# Patient Record
Sex: Male | Born: 1990 | Race: White | Hispanic: No | Marital: Single | Smoking: Never smoker
Health system: Southern US, Community
[De-identification: ages and names within clinical notes are randomized; demographics above are authoritative.]

---

## 2012-11-15 ENCOUNTER — Encounter (HOSPITAL_COMMUNITY): Payer: Self-pay | Admitting: Emergency Medicine

## 2012-11-15 ENCOUNTER — Emergency Department (HOSPITAL_COMMUNITY)
Admission: EM | Admit: 2012-11-15 | Discharge: 2012-11-15 | Disposition: A | Discharge status: Home / Self Care | Payer: Commercial Managed Care - PPO | Source: Home / Self Care | Attending: Family Medicine | Admitting: Family Medicine

## 2012-11-15 DIAGNOSIS — L0211 Cutaneous abscess of neck: Secondary | ICD-10-CM

## 2012-11-15 MED ORDER — DOXYCYCLINE HYCLATE 100 MG PO CAPS: 100.0000 mg | ORAL_CAPSULE | Freq: Two times a day (BID) | ORAL | Status: DC

## 2012-11-15 NOTE — ED Provider Notes (Signed)
History     CSN: [id number]  Arrival date & time 11/15/12  1632   First MD Initiated Contact with Patient 11/15/12 1644      Chief Complaint  Patient presents with  . Cyst    (Consider location/radiation/quality/duration/timing/severity/associated sxs/prior treatment) Patient is a 22 y.o. male presenting with abscess. The history is provided by the patient.  Abscess Location:  Head/neck Head/neck abscess location:  L neck Abscess quality: fluctuance, painful and redness   Red streaking: no   Duration:  3 days Progression:  Worsening Pain details:    Quality:  Pressure and dull   Severity:  Moderate Chronicity:  Recurrent Worsened by:  Topical antibiotics   History reviewed. No pertinent past medical history.  History reviewed. No pertinent past surgical history.  No family history on file.  History  Substance Use Topics  . Smoking status: Not on file  . Smokeless tobacco: Not on file  . Alcohol Use: Not on file      Review of Systems  Constitutional: Negative.   HENT: Positive for neck pain.   Skin: Positive for rash.    Allergies  Review of patient's allergies indicates no known allergies.  Home Medications   Current Outpatient Rx  Name  Route  Sig  Dispense  Refill  . doxycycline (VIBRAMYCIN) 100 MG capsule   Oral   Take 1 capsule (100 mg total) by mouth 2 (two) times daily.   20 capsule   0     BP 144/81  Pulse 58  Temp(Src) 98.3 F (36.8 C) (Oral)  Resp 16  SpO2 98%  Physical Exam  Nursing note and vitals reviewed. Constitutional: He appears well-developed and well-nourished.  Skin: Skin is warm and dry. There is erythema.  Fluctuant abscess to left neck.    ED Course  INCISION AND DRAINAGE Date/Time: 11/15/2012 5:34 PM Performed by: Linna Hoff Authorized by: Bradd Canary D Consent: Verbal consent obtained. Risks and benefits: risks, benefits and alternatives were discussed Consent given by: patient Type: abscess Body  area: head/neck Location details: neck Anesthesia: local infiltration Local anesthetic: lidocaine 2% without epinephrine Patient sedated: no Scalpel size: 11 Incision type: single straight Complexity: simple Drainage: purulent Drainage amount: moderate Wound treatment: wound left open Patient tolerance: Patient tolerated the procedure well with no immediate complications. Comments: Culture obtained.   (including critical care time)  Labs Reviewed  CULTURE, ROUTINE-ABSCESS   No results found.   1. Abscess of skin of neck       MDM          Linna Hoff, MD 11/15/12 [phone]

## 2012-11-15 NOTE — ED Notes (Signed)
Pt c/o cyst on left side of neck since Saturday Reports he was seeing a dermatologist in [place] who was going to remove it, but moved to [facility] [place] Denies: drainage, f/v/n/d Sx include: tender Has been using topical clindamycin   He is alert w/no signs of acute distress.

## 2012-11-19 LAB — CULTURE, ROUTINE-ABSCESS

## 2013-05-23 ENCOUNTER — Encounter (HOSPITAL_COMMUNITY): Payer: Self-pay

## 2013-05-23 ENCOUNTER — Emergency Department (HOSPITAL_COMMUNITY)
Admission: EM | Admit: 2013-05-23 | Discharge: 2013-05-23 | Disposition: A | Discharge status: Home / Self Care | Payer: Commercial Managed Care - PPO | Source: Home / Self Care | Attending: Emergency Medicine | Admitting: Emergency Medicine

## 2013-05-23 DIAGNOSIS — L509 Urticaria, unspecified: Secondary | ICD-10-CM

## 2013-05-23 MED ORDER — PREDNISONE 20 MG PO TABS: 40.0000 mg | ORAL_TABLET | Freq: Every day | ORAL | Status: AC

## 2013-05-23 MED ORDER — TRIAMCINOLONE ACETONIDE 0.1 % EX CREA: TOPICAL_CREAM | Freq: Two times a day (BID) | CUTANEOUS | Status: AC

## 2013-05-23 MED ORDER — HYDROXYZINE HCL 25 MG PO TABS: 25.0000 mg | ORAL_TABLET | Freq: Four times a day (QID) | ORAL | Status: AC

## 2013-05-23 NOTE — ED Provider Notes (Signed)
CSN: [id number]     Arrival date & time 05/23/13  1050 History     First MD Initiated Contact with Patient 05/23/13 1130     Chief Complaint  Patient presents with  . Rash   (Consider location/radiation/quality/duration/timing/severity/associated sxs/prior Treatment) HPI Comments: Patient presents urgent care describing for the last 4-5 days he has developed a rash that has since then progress. It has been affecting his lower extremities mainly his left side his knee his ankle region up to his thighs and some on his lower abdominal wall. It's itchy and it's kind of spreading have some on my forearms. Patient denies any further constitutional symptoms such as fevers generalized malaise arthralgias myalgias or headaches. He does mention that he recently returned from [place] and denies any further symptoms such as difficulty swallowing facial swelling or difficulty breathing. Patient has not tried to take anything over-the-counter as of yet.  Patient is a 22 y.o. male presenting with rash. The history is provided by the patient.  Rash Pain quality: no pressure, no stiffness, not throbbing and not tugging   Pain radiates to:  L leg, R leg, back and chest Pain severity:  Mild Onset quality:  Sudden Duration:  5 days Progression:  Worsening Chronicity:  New Context: recent travel   Context: not eating, not recent sexual activity, not retching, not sick contacts, not suspicious food intake and not trauma   Associated symptoms: no chills, no fatigue and no fever     History reviewed. No pertinent past medical history. History reviewed. No pertinent past surgical history. History reviewed. No pertinent family history. History  Substance Use Topics  . Smoking status: Never Smoker   . Smokeless tobacco: Not on file  . Alcohol Use: Yes    Review of Systems  Constitutional: Negative for fever, chills, diaphoresis, activity change, appetite change, fatigue and unexpected weight change.  Skin:  Positive for rash. Negative for color change, pallor and wound.  Hematological: Does not bruise/bleed easily.    Allergies  Review of patient's allergies indicates no known allergies.  Home Medications   Current Outpatient Rx  Name  Route  Sig  Dispense  Refill  . hydrOXYzine (ATARAX/VISTARIL) 25 MG tablet   Oral   Take 1 tablet (25 mg total) by mouth every 6 (six) hours.   12 tablet   0   . predniSONE (DELTASONE) 20 MG tablet   Oral   Take 2 tablets (40 mg total) by mouth daily. 2 tablets daily for 5 days   14 tablet   0   . triamcinolone cream (KENALOG) 0.1 %   Topical   Apply topically 2 (two) times daily. Apply bid x 2 weeks   30 g   0    BP 132/67  Pulse 60  Temp(Src) 98 F (36.7 C) (Oral)  Resp 16  SpO2 100% Physical Exam  Nursing note and vitals reviewed. Constitutional: Vital signs are normal. He appears well-developed and well-nourished.  Non-toxic appearance. He does not have a sickly appearance. He does not appear ill. No distress.  HENT:  Head: Normocephalic.  Mouth/Throat: No oropharyngeal exudate.  Eyes: Conjunctivae are normal. Right eye exhibits no discharge. Left eye exhibits no discharge. No scleral icterus.  Neck: Neck supple.  Neurological: He is alert.  Skin: Rash noted. No erythema.       ED Course   Procedures (including critical care time)  Labs Reviewed - No data to display No results found. 1. Urticaria     MDM  Physical exam was consistent with a global generalized urticarial and pruritic rash  Patient will be treated with both antihistamines, topical steroids and a short course of prednisone for 7 days. Have been advised that if no improvement after 14 days that he can consult with dermatologist. Patient agrees with treatment plan and followup care as necessary.   New Prescriptions   HYDROXYZINE (ATARAX/VISTARIL) 25 MG TABLET    Take 1 tablet (25 mg total) by mouth every 6 (six) hours.   PREDNISONE (DELTASONE) 20 MG TABLET     Take 2 tablets (40 mg total) by mouth daily. 2 tablets daily for 5 days   TRIAMCINOLONE CREAM (KENALOG) 0.1 %    Apply topically 2 (two) times daily. Apply bid x 2 weeks    Jimmie Molly, MD 05/23/13 1235

## 2013-05-23 NOTE — ED Notes (Signed)
Recent return from [place]; generalized rash past 5 days

## 2015-01-19 ENCOUNTER — Encounter (HOSPITAL_COMMUNITY): Payer: Self-pay | Admitting: Emergency Medicine

## 2015-01-19 ENCOUNTER — Emergency Department (HOSPITAL_COMMUNITY)
Admission: EM | Admit: 2015-01-19 | Discharge: 2015-01-19 | Disposition: A | Discharge status: Home / Self Care | Payer: Commercial Managed Care - PPO | Source: Home / Self Care | Attending: Family Medicine | Admitting: Family Medicine

## 2015-01-19 DIAGNOSIS — H6123 Impacted cerumen, bilateral: Secondary | ICD-10-CM

## 2015-01-19 DIAGNOSIS — H6092 Unspecified otitis externa, left ear: Secondary | ICD-10-CM | POA: Diagnosis not present

## 2015-01-19 MED ORDER — CIPROFLOXACIN-DEXAMETHASONE 0.3-0.1 % OT SUSP: 4.0000 [drp] | Freq: Two times a day (BID) | OTIC | Status: AC

## 2015-01-19 NOTE — ED Provider Notes (Signed)
CSN: [id number]     Arrival date & time 01/19/15  1210 History   First MD Initiated Contact with Patient 01/19/15 1251     Chief Complaint  Patient presents with  . Ear Fullness   (Consider location/radiation/quality/duration/timing/severity/associated sxs/prior Treatment) HPI    24 year old male presents complaining of bilateral ear fullness for 3 days. He also has decreased hearing. He assumes she has earwax, he was trying to remove it but he has caused pain and irritation in his left ear. No drainage. No systemic symptoms.  History reviewed. No pertinent past medical history. History reviewed. No pertinent past surgical history. No family history on file. History  Substance Use Topics  . Smoking status: Never Smoker   . Smokeless tobacco: Not on file  . Alcohol Use: Yes    Review of Systems  HENT: Positive for ear pain and hearing loss.   All other systems reviewed and are negative.   Allergies  Review of patient's allergies indicates no known allergies.  Home Medications   Prior to Admission medications   Medication Sig Start Date End Date Taking? Authorizing Provider  ciprofloxacin-dexamethasone (CIPRODEX) otic suspension Place 4 drops into the left ear 2 (two) times daily. 01/19/15   Graylon GoodZachary H Dejay Kronk, PA-C  hydrOXYzine (ATARAX/VISTARIL) 25 MG tablet Take 1 tablet (25 mg total) by mouth every 6 (six) hours. 05/23/13   Jimmie MollyPaolo Coll, MD   BP 111/71 mmHg  Pulse 71  Temp(Src) 97.6 F (36.4 C) (Oral)  Resp 16  SpO2 98% Physical Exam  Constitutional: He is oriented to person, place, and time. He appears well-developed and well-nourished. No distress.  HENT:  Head: Normocephalic and atraumatic.  Cerumen impaction occluding bilateral TMs   Pulmonary/Chest: Effort normal. No respiratory distress.  Neurological: He is alert and oriented to person, place, and time. Coordination normal.  Skin: Skin is warm and dry. No rash noted. He is not diaphoretic.  Psychiatric: He has a  normal mood and affect. Judgment normal.  Nursing note and vitals reviewed.   ED Course  Procedures (including critical care time) Labs Review Labs Reviewed - No data to display  Imaging Review No results found.   MDM   1. Cerumen impaction, bilateral   2. Otitis externa, left     Cerumen rinsed out. Examination afterwards reveals a normal right tympanic membrane, however the left is erythematous. The canal is erythematous and swollen as well. Treat with Ciprodex drops, follow-up when necessary if worsening  Meds ordered this encounter  Medications  . ciprofloxacin-dexamethasone (CIPRODEX) otic suspension    Sig: Place 4 drops into the left ear 2 (two) times daily.    Dispense:  7.5 mL    Refill:  0    Order Specific Question:  Supervising Provider    Answer:  Bradd CanaryKINDL, JAMES D [[id number]]     Graylon GoodZachary H Cambry Spampinato, PA-C 01/19/15 1304

## 2015-01-19 NOTE — ED Notes (Signed)
C/o bilateral ear fullness onset 3 days w/decreased hearing Denies fevers, chills, cold sx  Alert, no signs of acute distress.

## 2015-01-19 NOTE — Discharge Instructions (Signed)
Cerumen Impaction °A cerumen impaction is when the wax in your ear forms a plug. This plug usually causes reduced hearing. Sometimes it also causes an earache or dizziness. Removing a cerumen impaction can be difficult and painful. The wax sticks to the ear canal. The canal is sensitive and bleeds easily. If you try to remove a heavy wax buildup with a cotton tipped swab, you may push it in further. °Irrigation with water, suction, and small ear curettes may be used to clear out the wax. If the impaction is fixed to the skin in the ear canal, ear drops may be needed for a few days to loosen the wax. People who build up a lot of wax frequently can use ear wax removal products available in your local drugstore. °SEEK MEDICAL CARE IF:  °You develop an earache, increased hearing loss, or marked dizziness. °Document Released: 10/23/2004 Document Revised: 12/08/2011 Document Reviewed: 12/13/2009 °ExitCare® Patient Information ©2015 [employer]. This information is not intended to replace advice given to you by your health care provider. Make sure you discuss any questions you have with your health care provider. ° °Otitis Externa °Otitis externa is a bacterial or fungal infection of the outer ear canal. This is the area from the eardrum to the outside of the ear. Otitis externa is sometimes called "swimmer's ear." °CAUSES  °Possible causes of infection include: °· Swimming in dirty water. °· Moisture remaining in the ear after swimming or bathing. °· Mild injury (trauma) to the ear. °· Objects stuck in the ear (foreign body). °· Cuts or scrapes (abrasions) on the outside of the ear. °SIGNS AND SYMPTOMS  °The first symptom of infection is often itching in the ear canal. Later signs and symptoms may include swelling and redness of the ear canal, ear pain, and yellowish-white fluid (pus) coming from the ear. The ear pain may be worse when pulling on the earlobe. °DIAGNOSIS  °Your health care provider will perform a  physical exam. A sample of fluid may be taken from the ear and examined for bacteria or fungi. °TREATMENT  °Antibiotic ear drops are often given for 10 to 14 days. Treatment may also include pain medicine or corticosteroids to reduce itching and swelling. °HOME CARE INSTRUCTIONS  °· Apply antibiotic ear drops to the ear canal as prescribed by your health care provider. °· Take medicines only as directed by your health care provider. °· If you have diabetes, follow any additional treatment instructions from your health care provider. °· Keep all follow-up visits as directed by your health care provider. °PREVENTION  °· Keep your ear dry. Use the corner of a towel to absorb water out of the ear canal after swimming or bathing. °· Avoid scratching or putting objects inside your ear. This can damage the ear canal or remove the protective wax that lines the canal. This makes it easier for bacteria and fungi to grow. °· Avoid swimming in lakes, polluted water, or poorly chlorinated pools. °· You may use ear drops made of rubbing alcohol and vinegar after swimming. Combine equal parts of white vinegar and alcohol in a bottle. Put 3 or 4 drops into each ear after swimming. °SEEK MEDICAL CARE IF:  °· You have a fever. °· Your ear is still red, swollen, painful, or draining pus after 3 days. °· Your redness, swelling, or pain gets worse. °· You have a severe headache. °· You have redness, swelling, pain, or tenderness in the area behind your ear. °MAKE SURE YOU:  °·   Understand these instructions. °· Will watch your condition. °· Will get help right away if you are not doing well or get worse. °Document Released: 09/15/2005 Document Revised: 01/30/2014 Document Reviewed: 10/02/2011 °ExitCare® Patient Information ©2015 [employer]. This information is not intended to replace advice given to you by your health care provider. Make sure you discuss any questions you have with your health care provider. ° °

## 2015-03-11 ENCOUNTER — Encounter (HOSPITAL_COMMUNITY): Payer: Self-pay | Admitting: Emergency Medicine

## 2015-03-11 ENCOUNTER — Emergency Department (HOSPITAL_COMMUNITY)
Admission: EM | Admit: 2015-03-11 | Discharge: 2015-03-11 | Disposition: A | Discharge status: Home / Self Care | Payer: Commercial Managed Care - PPO | Source: Home / Self Care | Attending: Emergency Medicine | Admitting: Emergency Medicine

## 2015-03-11 DIAGNOSIS — L0211 Cutaneous abscess of neck: Secondary | ICD-10-CM | POA: Diagnosis not present

## 2015-03-11 MED ORDER — HYDROCODONE-ACETAMINOPHEN 7.5-325 MG PO TABS: 1.0000 | ORAL_TABLET | ORAL | Status: DC | PRN

## 2015-03-11 NOTE — ED Notes (Signed)
Reports a recurrent cyst on left neck.  Reports cyst has been i/d before.  This episode started 6/2.  Currently larger and more painful than in the past.

## 2015-03-11 NOTE — Discharge Instructions (Signed)
Abscess  An abscess is an infected area that contains a collection of pus and debris.It can occur in almost any part of the body. An abscess is also known as a furuncle or boil.  CAUSES   An abscess occurs when tissue gets infected. This can occur from blockage of oil or sweat glands, infection of hair follicles, or a minor injury to the skin. As the body tries to fight the infection, pus collects in the area and creates pressure under the skin. This pressure causes pain. People with weakened immune systems have difficulty fighting infections and get certain abscesses more often.   SYMPTOMS  Usually an abscess develops on the skin and becomes a painful mass that is red, warm, and tender. If the abscess forms under the skin, you may feel a moveable soft area under the skin. Some abscesses break open (rupture) on their own, but most will continue to get worse without care. The infection can spread deeper into the body and eventually into the bloodstream, causing you to feel ill.   DIAGNOSIS   Your caregiver will take your medical history and perform a physical exam. A sample of fluid may also be taken from the abscess to determine what is causing your infection.  TREATMENT   Your caregiver may prescribe antibiotic medicines to fight the infection. However, taking antibiotics alone usually does not cure an abscess. Your caregiver may need to make a small cut (incision) in the abscess to drain the pus. In some cases, gauze is packed into the abscess to reduce pain and to continue draining the area.  HOME CARE INSTRUCTIONS    Only take over-the-counter or prescription medicines for pain, discomfort, or fever as directed by your caregiver.   If you were prescribed antibiotics, take them as directed. Finish them even if you start to feel better.   If gauze is used, follow your caregiver's directions for changing the gauze.   To avoid spreading the infection:   Keep your draining abscess covered with a  bandage.   Wash your hands well.   Do not share personal care items, towels, or whirlpools with others.   Avoid skin contact with others.   Keep your skin and clothes clean around the abscess.   Keep all follow-up appointments as directed by your caregiver.  SEEK MEDICAL CARE IF:    You have increased pain, swelling, redness, fluid drainage, or bleeding.   You have muscle aches, chills, or a general ill feeling.   You have a fever.  MAKE SURE YOU:    Understand these instructions.   Will watch your condition.   Will get help right away if you are not doing well or get worse.  Document Released: 06/25/2005 Document Revised: 03/16/2012 Document Reviewed: 11/28/2011  ExitCare Patient Information 2015 [employer]. This information is not intended to replace advice given to you by your health care provider. Make sure you discuss any questions you have with your health care provider.  Incision and Drainage  Incision and drainage is a procedure in which a sac-like structure (cystic structure) is opened and drained. The area to be drained usually contains material such as pus, fluid, or blood.   LET YOUR CAREGIVER KNOW ABOUT:    Allergies to medicine.   Medicines taken, including vitamins, herbs, eyedrops, over-the-counter medicines, and creams.   Use of steroids (by mouth or creams).   Previous problems with anesthetics or numbing medicines.   History of bleeding problems or blood clots.     Previous surgery.   Other health problems, including diabetes and kidney problems.   Possibility of pregnancy, if this applies.  RISKS AND COMPLICATIONS   Pain.   Bleeding.   Scarring.   Infection.  BEFORE THE PROCEDURE   You may need to have an ultrasound or other imaging tests to see how large or deep your cystic structure is. Blood tests may also be used to determine if you have an infection or how severe the infection is. You may need to have a tetanus shot.  PROCEDURE   The affected area is cleaned with a  cleaning fluid. The cyst area will then be numbed with a medicine (local anesthetic). A small incision will be made in the cystic structure. A syringe or catheter may be used to drain the contents of the cystic structure, or the contents may be squeezed out. The area will then be flushed with a cleansing solution. After cleansing the area, it is often gently packed with a gauze or another wound dressing. Once it is packed, it will be covered with gauze and tape or some other type of wound dressing.  AFTER THE PROCEDURE    Often, you will be allowed to go home right after the procedure.   You may be given antibiotic medicine to prevent or heal an infection.   If the area was packed with gauze or some other wound dressing, you will likely need to come back in 1 to 2 days to get it removed.   The area should heal in about 14 days.  Document Released: 03/11/2001 Document Revised: 03/16/2012 Document Reviewed: 11/10/2011  ExitCare Patient Information 2015 [employer]. This information is not intended to replace advice given to you by your health care provider. Make sure you discuss any questions you have with your health care provider.

## 2015-03-11 NOTE — ED Provider Notes (Signed)
CSN: [id number]     Arrival date & time 03/11/15  1446 History   First MD Initiated Contact with Patient 03/11/15 1601     Chief Complaint  Patient presents with  . Neck Pain   (Consider location/radiation/quality/duration/timing/severity/associated sxs/prior Treatment) HPI Comments: Patient presents with recurrence of left neck infected cyst. He has had this before and was I&D the urgent care in the remote past. He did not follow-up to have the cyst completely removed. He is now complaining of pain, localized tenderness, redness to the left side of his neck.   History reviewed. No pertinent past medical history. History reviewed. No pertinent past surgical history. No family history on file. History  Substance Use Topics  . Smoking status: Never Smoker   . Smokeless tobacco: Not on file  . Alcohol Use: Yes    Review of Systems  Constitutional: Negative.   HENT: Negative.   Respiratory: Negative for cough and shortness of breath.   Musculoskeletal: Negative.   Skin: Positive for wound.  Psychiatric/Behavioral: Negative.     Allergies  Review of patient's allergies indicates no known allergies.  Home Medications   Prior to Admission medications   Medication Sig Start Date End Date Taking? Authorizing Provider  ciprofloxacin-dexamethasone (CIPRODEX) otic suspension Place 4 drops into the left ear 2 (two) times daily. 01/19/15   Graylon Good, PA-C  HYDROcodone-acetaminophen (NORCO) 7.5-325 MG per tablet Take 1 tablet by mouth every 4 (four) hours as needed. 03/11/15   Hayden Rasmussen, NP  hydrOXYzine (ATARAX/VISTARIL) 25 MG tablet Take 1 tablet (25 mg total) by mouth every 6 (six) hours. 05/23/13   Jimmie Molly, MD   BP 133/74 mmHg  Pulse 61  Temp(Src) 97.6 F (36.4 C) (Oral)  Resp 16  SpO2 99% Physical Exam  Constitutional: He is oriented to person, place, and time. He appears well-developed and well-nourished. No distress.  Neck: Normal range of motion. Neck supple.   Cardiovascular: Normal rate.   Pulmonary/Chest: Effort normal. No respiratory distress.  Musculoskeletal: He exhibits no edema or tenderness.  Lymphadenopathy:    He has no cervical adenopathy.  Neurological: He is alert and oriented to person, place, and time. He exhibits normal muscle tone.  Skin: Skin is warm and dry.  Abscess to his left neck as noted in history of present illness  Psychiatric: He has a normal mood and affect.  Nursing note and vitals reviewed.   ED Course  INCISION AND DRAINAGE Date/Time: 03/11/2015 5:39 PM Performed by: Phineas Real, Verta Riedlinger Authorized by: Charm Rings Consent: Verbal consent obtained. Risks and benefits: risks, benefits and alternatives were discussed Consent given by: patient Patient understanding: patient states understanding of the procedure being performed Patient identity confirmed: verbally with patient Type: abscess Body area: head/neck Location details: neck Anesthesia: local infiltration Local anesthetic: lidocaine 2% without epinephrine Anesthetic total: 7 ml Patient sedated: no Scalpel size: 11 Incision type: single with marsupialization Complexity: simple Drainage: purulent and  bloody Drainage amount: moderate Wound treatment: drain placed Packing material: 1/4 in iodoform gauze Patient tolerance: Patient tolerated the procedure well with no immediate complications   (including critical care time) Labs Review Labs Reviewed - No data to display  Imaging Review No results found.   MDM   1. Cutaneous abscess of neck    Warm compresses. Keep dressing on for 2 days if possible. Try not to pull the packing out. Warm compresses. For worsening or new symptoms such as redness, fever, chills, neck pain or stiffness return properly. Otherwise,  return in 2 days for wound check and packing removal. Norco 7.5 mg #15. Wound culture pending.   Hayden Rasmussen, NP 03/11/15 [phone]

## 2015-03-15 LAB — CULTURE, ROUTINE-ABSCESS: Gram Stain: NONE SEEN

## 2015-03-15 NOTE — ED Notes (Signed)
Final report of skin of neck abscess negative

## 2016-07-20 ENCOUNTER — Emergency Department (HOSPITAL_COMMUNITY): Payer: 59

## 2016-07-20 ENCOUNTER — Emergency Department (HOSPITAL_COMMUNITY)
Admission: EM | Admit: 2016-07-20 | Discharge: 2016-07-20 | Disposition: A | Discharge status: Home / Self Care | Payer: 59 | Attending: Emergency Medicine | Admitting: Emergency Medicine

## 2016-07-20 ENCOUNTER — Encounter (HOSPITAL_COMMUNITY): Payer: Self-pay | Admitting: Emergency Medicine

## 2016-07-20 DIAGNOSIS — Y92410 Unspecified street and highway as the place of occurrence of the external cause: Secondary | ICD-10-CM | POA: Diagnosis not present

## 2016-07-20 DIAGNOSIS — S3992XA Unspecified injury of lower back, initial encounter: Secondary | ICD-10-CM | POA: Diagnosis present

## 2016-07-20 DIAGNOSIS — S32018A Other fracture of first lumbar vertebra, initial encounter for closed fracture: Secondary | ICD-10-CM | POA: Diagnosis not present

## 2016-07-20 DIAGNOSIS — Y9389 Activity, other specified: Secondary | ICD-10-CM | POA: Insufficient documentation

## 2016-07-20 DIAGNOSIS — Y999 Unspecified external cause status: Secondary | ICD-10-CM | POA: Diagnosis not present

## 2016-07-20 DIAGNOSIS — S32010A Wedge compression fracture of first lumbar vertebra, initial encounter for closed fracture: Secondary | ICD-10-CM

## 2016-07-20 MED ORDER — HYDROCODONE-ACETAMINOPHEN 5-325 MG PO TABS: 1.0000 | ORAL_TABLET | ORAL | 0 refills | Status: AC | PRN
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
## 2016-07-20 NOTE — Discharge Instructions (Signed)
Return here as needed.  Follow-up with the neurosurgeon provided by calling his office tomorrow for an appointment in one week

## 2016-07-20 NOTE — ED Notes (Signed)
Declined W/C at D/C and was escorted to lobby by RN. 
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
## 2016-07-20 NOTE — Progress Notes (Signed)
[facility] Progress Note Patient Details:  Juan Eaton 02/01/1991 [id number]  Patient ID: Juan SimmondsEric Eaton, male   DOB: 01/15/1991, 25 y.o.   MRN: [id number]   Saul FordyceJennifer C [name] 07/20/2016, 10:46 AMCalled [employer] for TLSO brace.

## 2016-12-05 ENCOUNTER — Emergency Department (HOSPITAL_COMMUNITY)
Admission: EM | Admit: 2016-12-05 | Discharge: 2016-12-05 | Disposition: A | Discharge status: Home / Self Care | Payer: 59 | Attending: Emergency Medicine | Admitting: Emergency Medicine

## 2016-12-05 ENCOUNTER — Encounter (HOSPITAL_COMMUNITY): Payer: Self-pay

## 2016-12-05 DIAGNOSIS — K5901 Slow transit constipation: Secondary | ICD-10-CM | POA: Insufficient documentation

## 2016-12-05 DIAGNOSIS — K5900 Constipation, unspecified: Secondary | ICD-10-CM | POA: Diagnosis present

## 2016-12-05 MED ORDER — POLYETHYLENE GLYCOL 3350 17 GM/SCOOP PO POWD: ORAL | 0 refills | Status: AC

## 2016-12-05 MED ORDER — FLEET ENEMA 7-19 GM/118ML RE ENEM: 1.0000 | ENEMA | Freq: Once | RECTAL | 0 refills | Status: AC | PRN

## 2016-12-05 NOTE — ED Triage Notes (Signed)
Pt reports constipation for about a week. He was able to have a small BM today but noticed drops of blood in the toilet. He reports pain with straining.

## 2016-12-05 NOTE — ED Provider Notes (Signed)
[facility] Provider Note   CSN: [id number] Arrival date & time: 12/05/16  1527     History   Chief Complaint Chief Complaint  Patient presents with  . Constipation    HPI Juan Eaton is a 26 y.o. male.  The history is provided by the patient.  Constipation   This is a new problem. Episode onset: 1 week ago. The stool is described as blood tinged (hard). Pertinent negatives include no abdominal pain. There is no fiber in the patient's diet. He does not exercise regularly. Treatments tried: stool softeners. The treatment provided no relief.    History reviewed. No pertinent past medical history.  There are no active problems to display for this patient.   History reviewed. No pertinent surgical history.     Home Medications    Prior to Admission medications   Medication Sig Start Date End Date Taking? Authorizing Provider  ciprofloxacin-dexamethasone (CIPRODEX) otic suspension Place 4 drops into the left ear 2 (two) times daily. 01/19/15   Graylon Good, PA-C  HYDROcodone-acetaminophen (NORCO/VICODIN) 5-325 MG tablet Take 1 tablet by mouth every 4 (four) hours as needed for moderate pain. 07/20/16   Charlestine Night, PA-C  hydrOXYzine (ATARAX/VISTARIL) 25 MG tablet Take 1 tablet (25 mg total) by mouth every 6 (six) hours. 05/23/13   Jimmie Molly, MD  polyethylene glycol powder (MIRALAX) powder TAKE 6 CAPFULS OF MIRALAX IN A 32 OUNCE GATORADE AND DRINK THE WHOLE BEVERAGE FOLLOWED BY 3 CAPFULS TWICE A DAY FOR THE NEXT WEEK AND FOLLOW UP WITH YOUR PRIMARY CARE PHYSICIAN. 12/05/16   Lyndal Pulley, MD  sodium phosphate (FLEET) 7-19 GM/118ML ENEM Place 133 mLs (1 enema total) rectally Once PRN for severe constipation. 12/05/16   Lyndal Pulley, MD    Family History No family history on file.  Social History Social History  Substance Use Topics  . Smoking status: Never Smoker  . Smokeless tobacco: Never Used  . Alcohol use Yes     Allergies   Patient has no known  allergies.   Review of Systems Review of Systems  Gastrointestinal: Positive for constipation. Negative for abdominal pain.  All other systems reviewed and are negative.    Physical Exam Updated Vital Signs BP 132/79 (BP Location: Left Arm)   Pulse 73   Temp 97.5 F (36.4 C) (Oral)   Resp 16   SpO2 100%   Physical Exam  Constitutional: He is oriented to person, place, and time. He appears well-developed and well-nourished. No distress.  HENT:  Head: Normocephalic and atraumatic.  Nose: Nose normal.  Eyes: Conjunctivae are normal.  Neck: Neck supple. No tracheal deviation present.  Cardiovascular: Normal rate, regular rhythm and normal heart sounds.   Pulmonary/Chest: Effort normal and breath sounds normal. No respiratory distress.  Abdominal: Soft. He exhibits no distension. There is no tenderness. There is no rebound.  Genitourinary: Rectum normal.  Genitourinary Comments: No impacted stool in vault  Neurological: He is alert and oriented to person, place, and time.  Skin: Skin is warm and dry.  Psychiatric: He has a normal mood and affect.  Vitals reviewed.    ED Treatments / Results  Labs (all labs ordered are listed, but only abnormal results are displayed) Labs Reviewed - No data to display  EKG  EKG Interpretation None       Radiology No results found.  Procedures Procedures (including critical care time)  Medications Ordered in ED Medications - No data to display   Initial Impression / Assessment and  Plan / ED Course  I have reviewed the triage vital signs and the nursing notes.  Pertinent labs & imaging results that were available during my care of the patient were reviewed by me and considered in my medical decision making (see chart for details).     26 y.o. male presents with ongoing constipation for the last 7 days without any relief using home remedies. No gross blood on rectal exam with chaperone, has had blood in stool since straining  likely 2/2 constipation and hard stools. Reassuring abdominal examination. Otherwise well-appearing. Currently no signs of obstruction or other complication. Discussed home laxative therapy and enemas to help relieve symptoms. Plan to follow up with PCP as needed and return precautions discussed for worsening or new concerning symptoms.  Final Clinical Impressions(s) / ED Diagnoses   Final diagnoses:  Slow transit constipation    New Prescriptions New Prescriptions   POLYETHYLENE GLYCOL POWDER (MIRALAX) POWDER    TAKE 6 CAPFULS OF MIRALAX IN A 32 OUNCE GATORADE AND DRINK THE WHOLE BEVERAGE FOLLOWED BY 3 CAPFULS TWICE A DAY FOR THE NEXT WEEK AND FOLLOW UP WITH YOUR PRIMARY CARE PHYSICIAN.   SODIUM PHOSPHATE (FLEET) 7-19 GM/118ML ENEM    Place 133 mLs (1 enema total) rectally Once PRN for severe constipation.     Lyndal Pulleyaniel Braheem Tomasik, MD 12/06/16 [phone]

## 2016-12-09 ENCOUNTER — Encounter (HOSPITAL_COMMUNITY): Payer: Self-pay

## 2016-12-09 ENCOUNTER — Emergency Department (HOSPITAL_COMMUNITY): Payer: 59

## 2016-12-09 ENCOUNTER — Emergency Department (HOSPITAL_COMMUNITY)
Admission: EM | Admit: 2016-12-09 | Discharge: 2016-12-09 | Disposition: A | Discharge status: Home / Self Care | Payer: 59 | Attending: Emergency Medicine | Admitting: Emergency Medicine

## 2016-12-09 DIAGNOSIS — R1084 Generalized abdominal pain: Secondary | ICD-10-CM | POA: Diagnosis present

## 2016-12-09 DIAGNOSIS — Z79899 Other long term (current) drug therapy: Secondary | ICD-10-CM | POA: Diagnosis not present

## 2016-12-09 DIAGNOSIS — K529 Noninfective gastroenteritis and colitis, unspecified: Secondary | ICD-10-CM | POA: Insufficient documentation

## 2016-12-09 LAB — CBC WITH DIFFERENTIAL/PLATELET
BASOS ABS: 0 10*3/uL (ref 0.0–0.1)
Basophils Relative: 0 %
EOS ABS: 0.2 10*3/uL (ref 0.0–0.7)
Eosinophils Relative: 2 %
HCT: 43.3 % (ref 39.0–52.0)
Hemoglobin: 14.9 g/dL (ref 13.0–17.0)
LYMPHS ABS: 1.2 10*3/uL (ref 0.7–4.0)
Lymphocytes Relative: 11 %
MCH: 31.3 pg (ref 26.0–34.0)
MCHC: 34.4 g/dL (ref 30.0–36.0)
MCV: 91 fL (ref 78.0–100.0)
Monocytes Absolute: 1.4 10*3/uL — ABNORMAL HIGH (ref 0.1–1.0)
Monocytes Relative: 13 %
NEUTROS PCT: 74 %
Neutro Abs: 8.3 10*3/uL — ABNORMAL HIGH (ref 1.7–7.7)
Platelets: 262 10*3/uL (ref 150–400)
RBC: 4.76 MIL/uL (ref 4.22–5.81)
RDW: 12.8 % (ref 11.5–15.5)
WBC: 11.1 10*3/uL — AB (ref 4.0–10.5)

## 2016-12-09 LAB — COMPREHENSIVE METABOLIC PANEL
ALT: 15 U/L — AB (ref 17–63)
AST: 19 U/L (ref 15–41)
Albumin: 4 g/dL (ref 3.5–5.0)
Alkaline Phosphatase: 73 U/L (ref 38–126)
Anion gap: 9 (ref 5–15)
BUN: 5 mg/dL — AB (ref 6–20)
CALCIUM: 9.4 mg/dL (ref 8.9–10.3)
CO2: 27 mmol/L (ref 22–32)
CREATININE: 1.06 mg/dL (ref 0.61–1.24)
Chloride: 102 mmol/L (ref 101–111)
GFR calc non Af Amer: >60 mL/min (ref >60)
Glucose, Bld: 96 mg/dL (ref 65–99)
Potassium: 4 mmol/L (ref 3.5–5.1)
SODIUM: 138 mmol/L (ref 135–145)
Total Bilirubin: 1.1 mg/dL (ref 0.3–1.2)
Total Protein: 6.5 g/dL (ref 6.5–8.1)

## 2016-12-09 LAB — I-STAT CG4 LACTIC ACID, ED: Lactic Acid, Venous: 1.14 mmol/L (ref 0.5–1.9)

## 2016-12-09 LAB — LIPASE, BLOOD: Lipase: 11 U/L (ref 11–51)

## 2016-12-09 MED ORDER — ONDANSETRON HCL 4 MG/2ML IJ SOLN
4.0000 mg | Freq: Once | INTRAMUSCULAR | Status: AC
  Administered 2016-12-09: 4 mg via INTRAVENOUS
  Filled 2016-12-09: qty 2

## 2016-12-09 MED ORDER — IOPAMIDOL (ISOVUE-300) INJECTION 61%
INTRAVENOUS | Status: AC
  Administered 2016-12-09: 100 mL
  Filled 2016-12-09: qty 100

## 2016-12-09 MED ORDER — SODIUM CHLORIDE 0.9 % IV BOLUS (SEPSIS)
1000.0000 mL | Freq: Once | INTRAVENOUS | Status: AC
  Administered 2016-12-09: 1000 mL via INTRAVENOUS

## 2016-12-09 MED ORDER — CIPROFLOXACIN HCL 500 MG PO TABS: 500.0000 mg | ORAL_TABLET | Freq: Two times a day (BID) | ORAL | 0 refills | Status: AC

## 2016-12-09 MED ORDER — METRONIDAZOLE 500 MG PO TABS: 500.0000 mg | ORAL_TABLET | Freq: Two times a day (BID) | ORAL | 0 refills | Status: AC

## 2016-12-09 NOTE — ED Notes (Signed)
Patient c/o abd. Pain States he was here last week with same was given Muralax and saline enema with relief. States he is passing gas and states he has rectal pain and pressure. Only passes blood with " red tissue" bowel sounds present slow.

## 2016-12-09 NOTE — ED Provider Notes (Signed)
[facility] Provider Note   CSN: [id number] Arrival date & time: 12/09/16  1002     History   Chief Complaint Chief Complaint  Patient presents with  . Abdominal Pain  . Constipation    HPI Juan Eaton is a 26 y.o. male.  Patient is a 26 year old healthy male presenting today with persistent abdominal pain and bloody stools. Patient was seen 5 days ago for similar symptoms. At that time he was diagnosed with constipation but states despite using MiraLAX and having some liquid brown stool he continues to feel constipated. He is now passing dark bloody clots and having the sensation of needing to defecate often. This morning he had significant generalized abdominal cramping that is much worse than anything he has had before. He has no prior history of ulcerative colitis or Crohn's disease. He denies any urinary symptoms. He does not have any pain burning or stinging of the rectum and is not painful to wipe.  He denies fevers but has had chills. No recent travel, antibiotic use her pain medication. He has had no recent procedures. He does not usually suffer from constipation and is unclear why he would be constipated.   The history is provided by the patient.  Abdominal Pain   This is a new problem. The current episode started more than 1 week ago. The problem occurs constantly. The problem has been gradually worsening. The pain is associated with an unknown factor. The pain is located in the generalized abdominal region. The quality of the pain is cramping and colicky. The pain is at a severity of 5/10. The pain is moderate. Associated symptoms include anorexia, nausea and constipation. Pertinent negatives include fever. Associated symptoms comments: Bloody stools and feels constipated.  Severe cramping this morning but seems a little better now. The symptoms are aggravated by eating. Nothing relieves the symptoms.  Constipation   Associated symptoms include abdominal pain.     History reviewed. No pertinent past medical history.  There are no active problems to display for this patient.   History reviewed. No pertinent surgical history.     Home Medications    Prior to Admission medications   Medication Sig Start Date End Date Taking? Authorizing Provider  ciprofloxacin-dexamethasone (CIPRODEX) otic suspension Place 4 drops into the left ear 2 (two) times daily. 01/19/15   Graylon Good, PA-C  HYDROcodone-acetaminophen (NORCO/VICODIN) 5-325 MG tablet Take 1 tablet by mouth every 4 (four) hours as needed for moderate pain. 07/20/16   Charlestine Night, PA-C  hydrOXYzine (ATARAX/VISTARIL) 25 MG tablet Take 1 tablet (25 mg total) by mouth every 6 (six) hours. 05/23/13   Jimmie Molly, MD  polyethylene glycol powder (MIRALAX) powder TAKE 6 CAPFULS OF MIRALAX IN A 32 OUNCE GATORADE AND DRINK THE WHOLE BEVERAGE FOLLOWED BY 3 CAPFULS TWICE A DAY FOR THE NEXT WEEK AND FOLLOW UP WITH YOUR PRIMARY CARE PHYSICIAN. 12/05/16   Lyndal Pulley, MD  sodium phosphate (FLEET) 7-19 GM/118ML ENEM Place 133 mLs (1 enema total) rectally Once PRN for severe constipation. 12/05/16   Lyndal Pulley, MD    Family History No family history on file.  Social History Social History  Substance Use Topics  . Smoking status: Never Smoker  . Smokeless tobacco: Never Used  . Alcohol use Yes     Allergies   Patient has no known allergies.   Review of Systems Review of Systems  Constitutional: Negative for fever.  Gastrointestinal: Positive for abdominal pain, anorexia, constipation and nausea.  All other systems  reviewed and are negative.    Physical Exam Updated Vital Signs BP 121/71 (BP Location: Left Arm)   Pulse 72   Temp 97.4 F (36.3 C) (Oral)   Resp 18   Ht 5\' 6"  (1.676 m)   Wt 155 lb (70.3 kg)   SpO2 100%   BMI 25.02 kg/m   Physical Exam  Constitutional: He is oriented to person, place, and time. He appears well-developed and well-nourished. No distress.  HENT:   Head: Normocephalic and atraumatic.  Mouth/Throat: Oropharynx is clear and moist.  Eyes: Conjunctivae and EOM are normal. Pupils are equal, round, and reactive to light.  Neck: Normal range of motion. Neck supple.  Cardiovascular: Normal rate, regular rhythm and intact distal pulses.   No murmur heard. Pulmonary/Chest: Effort normal and breath sounds normal. No respiratory distress. He has no wheezes. He has no rales.  Abdominal: Soft. He exhibits no distension. There is no tenderness. There is no rebound and no guarding.  Genitourinary: Prostate normal. Rectal exam shows no external hemorrhoid and no mass. Prostate is not enlarged and not tender.  Genitourinary Comments: Small streak of bright red blood on glove with rectal exam but non-tender  Musculoskeletal: Normal range of motion. He exhibits no edema or tenderness.  Neurological: He is alert and oriented to person, place, and time.  Skin: Skin is warm and dry. No rash noted. No erythema.  Psychiatric: He has a normal mood and affect. His behavior is normal.  Nursing note and vitals reviewed.    ED Treatments / Results  Labs (all labs ordered are listed, but only abnormal results are displayed) Labs Reviewed  CBC WITH DIFFERENTIAL/PLATELET - Abnormal; Notable for the following:       Result Value   WBC 11.1 (*)    Neutro Abs 8.3 (*)    Monocytes Absolute 1.4 (*)    All other components within normal limits  COMPREHENSIVE METABOLIC PANEL - Abnormal; Notable for the following:    BUN 5 (*)    ALT 15 (*)    All other components within normal limits  LIPASE, BLOOD  I-STAT CG4 LACTIC ACID, ED    EKG  EKG Interpretation None       Radiology Ct Abdomen Pelvis W Contrast  Result Date: 12/09/2016 CLINICAL DATA:  Abdominal pain. Rectal pain and pressure for over 1 week. EXAM: CT ABDOMEN AND PELVIS WITH CONTRAST TECHNIQUE: Multidetector CT imaging of the abdomen and pelvis was performed using the standard protocol following  bolus administration of intravenous contrast. CONTRAST:  100 ml ISOVUE-300 IOPAMIDOL (ISOVUE-300) INJECTION 61% COMPARISON:  None. FINDINGS: Lower chest: Mild dependent atelectasis is seen in the right lung base. No pleural or pericardial effusion. Hepatobiliary: No focal liver abnormality is seen. No gallstones, gallbladder wall thickening, or biliary dilatation. Pancreas: Unremarkable. No pancreatic ductal dilatation or surrounding inflammatory changes. Spleen: Normal in size without focal abnormality. Adrenals/Urinary Tract: Adrenal glands are unremarkable. Kidneys are normal, without renal calculi, focal lesion, or hydronephrosis. Bladder is unremarkable. Stomach/Bowel: The walls of the distal descending colon through the rectosigmoid to the anus are thickened and hyperenhancing. The colon otherwise appears normal. The small bowel including the terminal ileum is normal in appearance. The stomach and appendix also appear normal. Vascular/Lymphatic: No significant vascular findings are present. No enlarged abdominal or pelvic lymph nodes. Reproductive: Prostate is unremarkable. Other: No fluid collection or hernia. Musculoskeletal: Segmentation anomaly at T12-L1 is identified. No fracture or worrisome lesion. IMPRESSION: Findings consistent with infectious or inflammatory colitis from the  distal descending throughout the rectosigmoid. No other acute abnormality. Electronically Signed   By: Drusilla Kannerhomas  Dalessio M.D.   On: 12/09/2016 12:54    Procedures Procedures (including critical care time)  Medications Ordered in ED Medications  ondansetron (ZOFRAN) injection 4 mg (not administered)  sodium chloride 0.9 % bolus 1,000 mL (not administered)     Initial Impression / Assessment and Plan / ED Course  I have reviewed the triage vital signs and the nursing notes.  Pertinent labs & imaging results that were available during my care of the patient were reviewed by me and considered in my medical decision  making (see chart for details).    Patient presenting with persistent feelings of constipation now passing more bloody stool and having abdominal cramping and pain. Low suspicion that patient is actually constipated. Rectal exam without prostate tenderness or evidence of hemorrhoids. Concern for potential colitis of some sort causing his pain and recurrent feelings of needing to defecate. Low suspicion for infectious etiology as he's had no fever but has complained of chills. Low suspicion for C. difficile and no recent antibiotic use. He has no urinary complaints and there are no symptoms suggestive of cholecystitis or pancreatitis. No significant tenderness concerning for perforated viscus.  CBC, CMP, CT of the abdomen and pelvis pending.   2:22 PM Labs without significant finding. CT consistent with infectious versus inflammatory colitis. Spoke with the [facility] who recommended 10 days of Cipro and Flagyl and follow-up within 2 weeks for recheck.  Final Clinical Impressions(s) / ED Diagnoses   Final diagnoses:  Colitis    New Prescriptions New Prescriptions   CIPROFLOXACIN (CIPRO) 500 MG TABLET    Take 1 tablet (500 mg total) by mouth 2 (two) times daily.   METRONIDAZOLE (FLAGYL) 500 MG TABLET    Take 1 tablet (500 mg total) by mouth 2 (two) times daily.     Gwyneth SproutWhitney Treylen Gibbs, MD 12/09/16 1422

## 2016-12-09 NOTE — ED Triage Notes (Signed)
Pt reports being here on Friday for constipation and was given medication to help. Pt reports no change in his constipation and increase in abdominal pain. Complains of nausea, but denies vomiting. Blood noted when he tries to have a bowel movement.

## 2016-12-09 NOTE — ED Notes (Signed)
Pt comfortable with discharge and follow up instructions. Pt declines wheelchair, escorted to waiting area by this RN. Rx x2 

## 2018-07-02 IMAGING — CT CT ABD-PELV W/ CM
2 of 4 series · 16 of 46 positions shown, 18 images · IV contrast (Omni 300)
Comparison: None.

CLINICAL DATA: Abdominal pain. Rectal pain and pressure for over 1
week.

EXAM:
CT ABDOMEN AND PELVIS WITH CONTRAST
TECHNIQUE: Multidetector CT imaging of the abdomen and pelvis was performed
using the standard protocol following bolus administration of
intravenous contrast.
CONTRAST:  100 ml MMADDD-ANN IOPAMIDOL (MMADDD-ANN) INJECTION 61%

[Series 3: a/p w/ 5mm · axial · 0.67mm/px · z∈[-572,-132]mm · 13 of 97 slices shown, 15 images]
[im 5/97  soft-tissue]
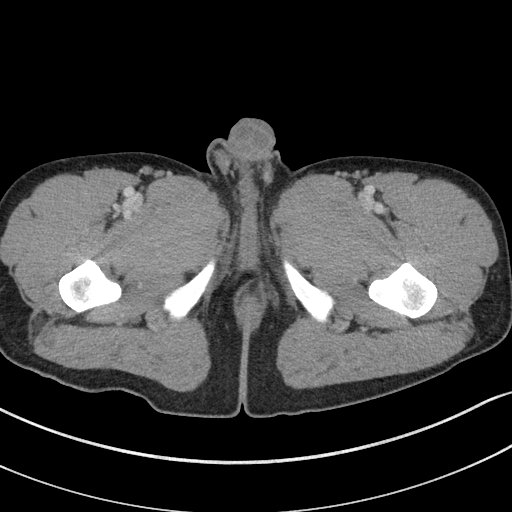
[im 5/97  bone]
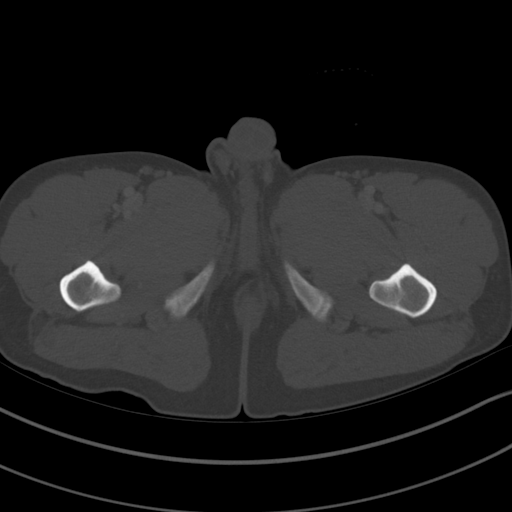
[im 13/97  soft-tissue]
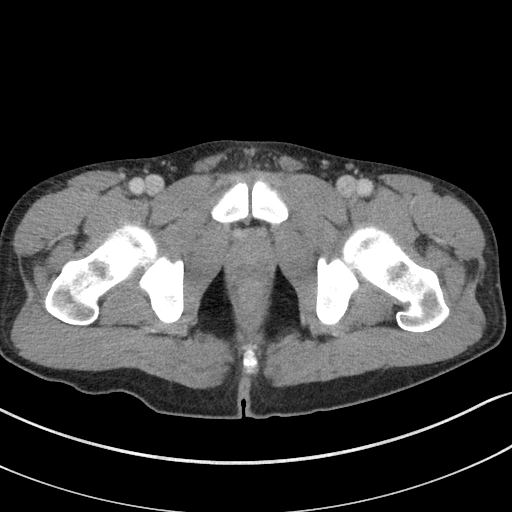
[im 21/97  soft-tissue]
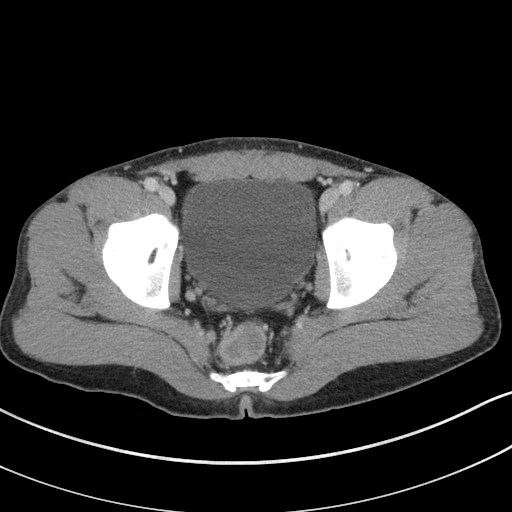
[im 29/97  soft-tissue]
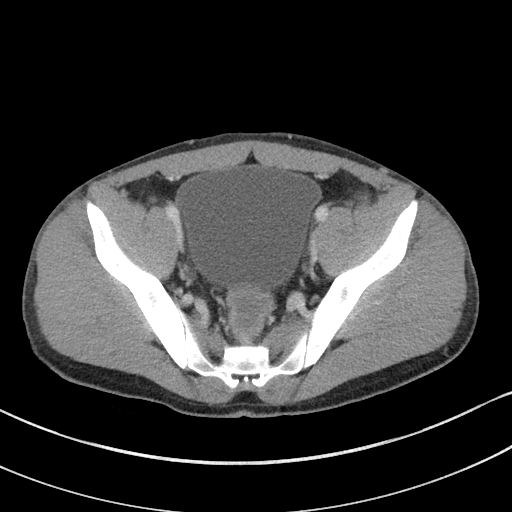
[im 33/97  soft-tissue]
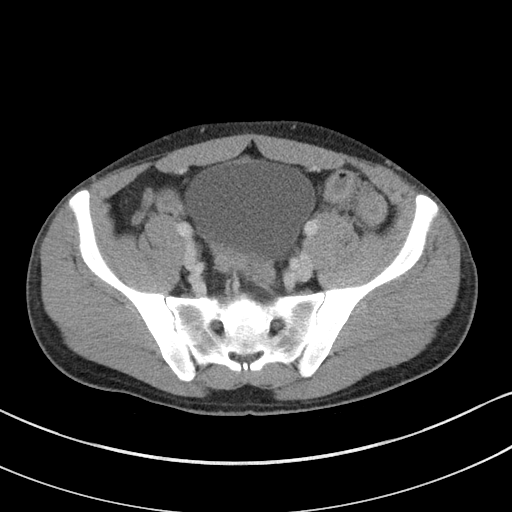
[im 41/97  soft-tissue]
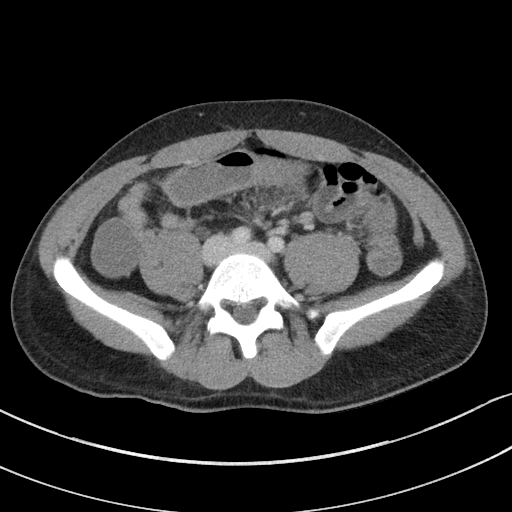
[im 49/97  soft-tissue]
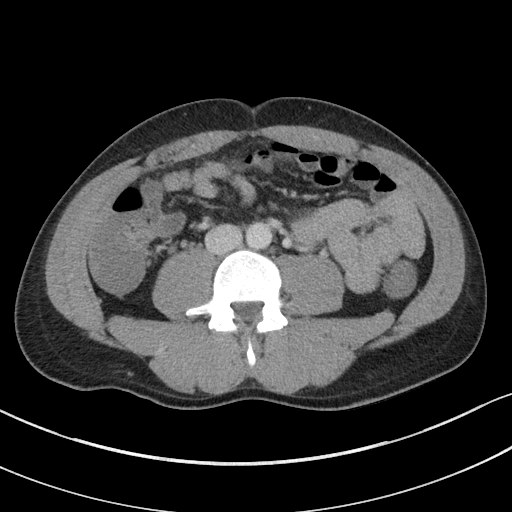
[im 57/97  soft-tissue]
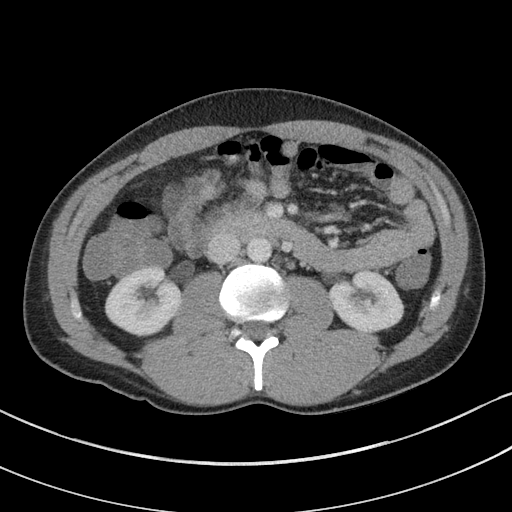
[im 65/97  soft-tissue]
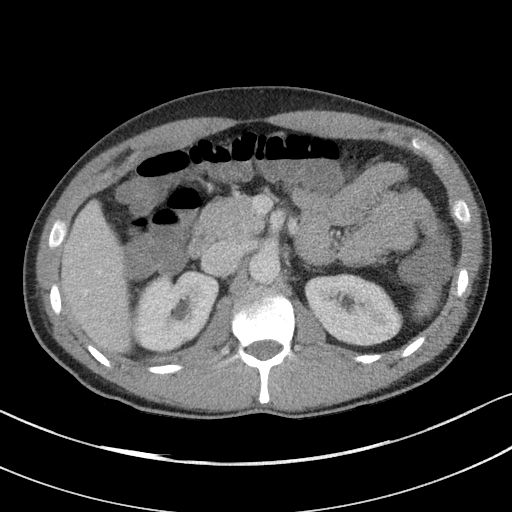
[im 65/97  bone]
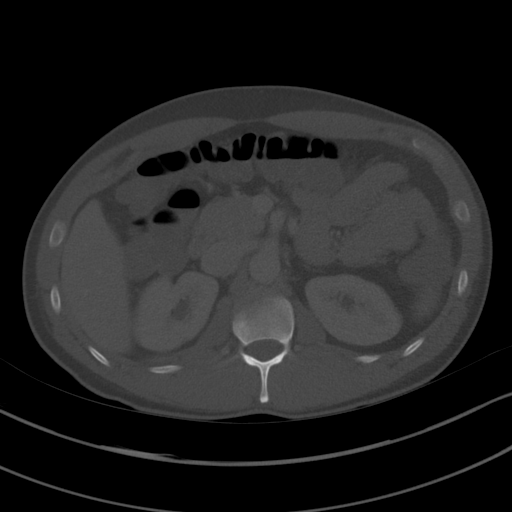
[im 69/97  soft-tissue]
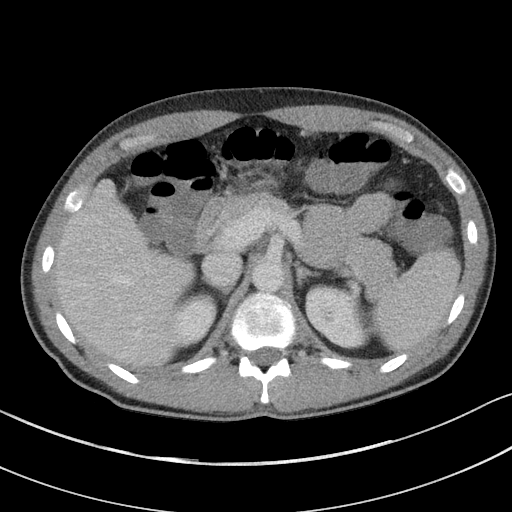
[im 77/97  soft-tissue]
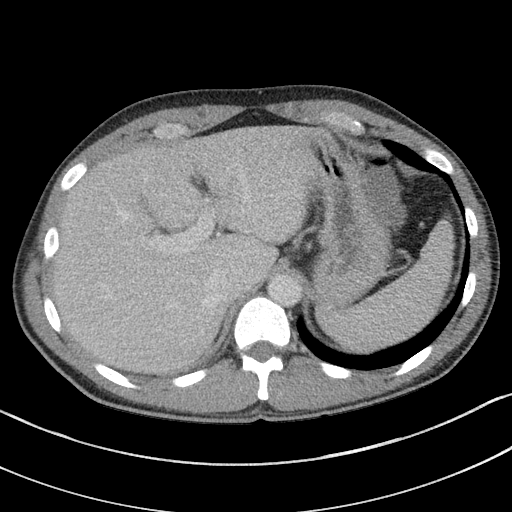
[im 85/97  soft-tissue]
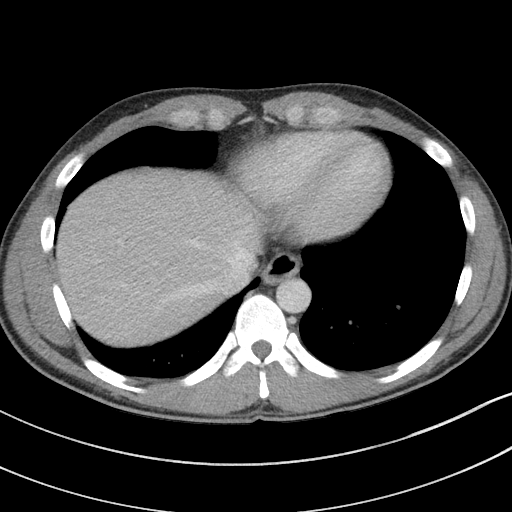
[im 93/97  soft-tissue]
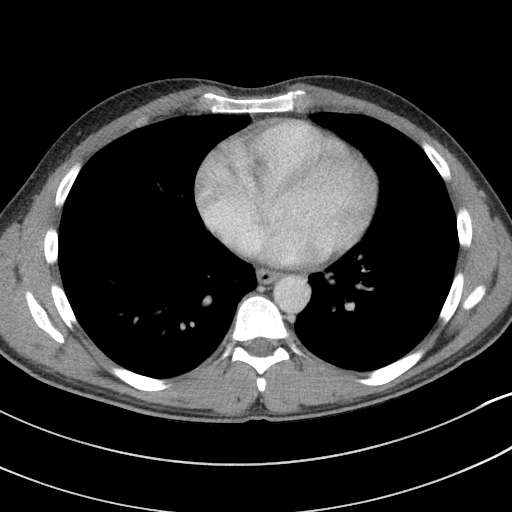

[Series 6: a/p w/ cor · coronal · 0.68mm/px · 3 of 151 slices shown]
[im 51/151  soft-tissue]
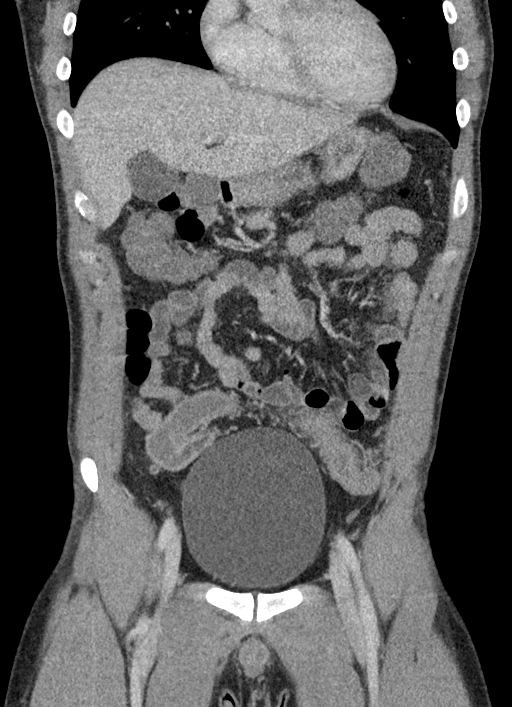
[im 67/151  soft-tissue]
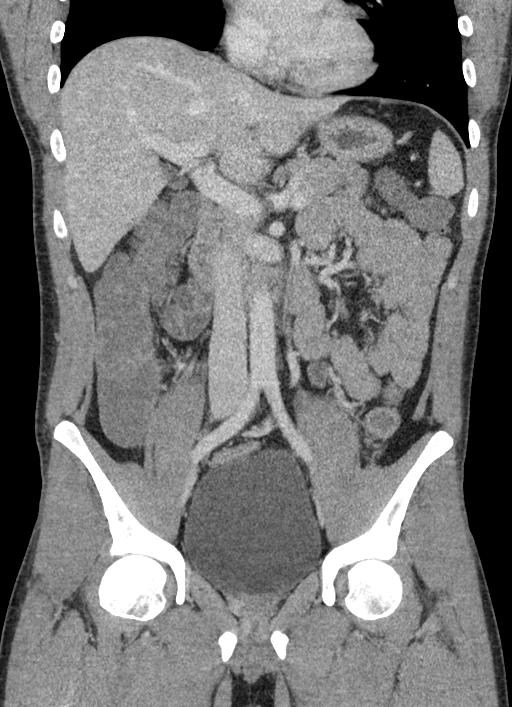
[im 84/151  soft-tissue]
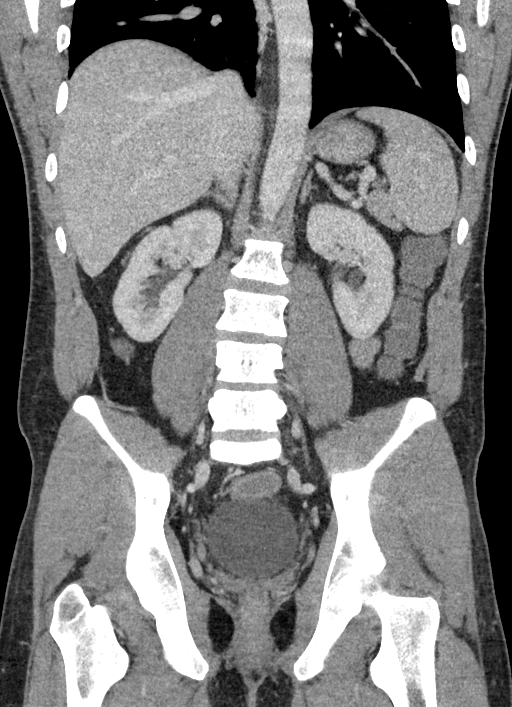

[16 of 46 positions shown; findings below may reference images not displayed]

FINDINGS: Lower chest: Mild dependent atelectasis is seen in the right lung
base. No pleural or pericardial effusion.

Hepatobiliary: No focal liver abnormality is seen. No gallstones,
gallbladder wall thickening, or biliary dilatation.

Pancreas: Unremarkable. No pancreatic ductal dilatation or
surrounding inflammatory changes.

Spleen: Normal in size without focal abnormality.

Adrenals/Urinary Tract: Adrenal glands are unremarkable. Kidneys are
normal, without renal calculi, focal lesion, or hydronephrosis.
Bladder is unremarkable.

Stomach/Bowel: The walls of the distal descending colon through the
rectosigmoid to the anus are thickened and hyperenhancing. The colon
otherwise appears normal. The small bowel including the terminal
ileum is normal in appearance. The stomach and appendix also appear
normal.

Vascular/Lymphatic: No significant vascular findings are present. No
enlarged abdominal or pelvic lymph nodes.

Reproductive: Prostate is unremarkable.

Other: No fluid collection or hernia.

Musculoskeletal: Segmentation anomaly at T12-L1 is identified. No
fracture or worrisome lesion.
IMPRESSION: Findings consistent with infectious or inflammatory colitis from the
distal descending throughout the rectosigmoid. No other acute
abnormality.
